# Patient Record
Sex: Female | Born: 1970 | Race: White | Hispanic: No | Marital: Married | Smoking: Never smoker
Health system: Southern US, Community
[De-identification: ages and names within clinical notes are randomized; demographics above are authoritative.]

## PROBLEM LIST (undated history)

## (undated) HISTORY — PX: CHOLECYSTECTOMY: SHX55

---

## 2006-10-09 ENCOUNTER — Ambulatory Visit (HOSPITAL_COMMUNITY): Admission: RE | Admit: 2006-10-09 | Discharge: 2006-10-09 | Payer: Self-pay | Admitting: Surgery

## 2006-10-09 ENCOUNTER — Encounter (INDEPENDENT_AMBULATORY_CARE_PROVIDER_SITE_OTHER): Payer: Self-pay | Admitting: Surgery

## 2007-11-28 ENCOUNTER — Emergency Department (HOSPITAL_COMMUNITY): Admission: EM | Admit: 2007-11-28 | Discharge: 2007-11-28 | Payer: Self-pay | Admitting: Emergency Medicine

## 2009-03-29 IMAGING — CT CT ABDOMEN W/O CM
5 of 9 series · 13 of 42 positions shown, 18 images · IV contrast (CONTRAST)
Comparison: NONE

CLINICAL DATA: Mass seen in liver, evaluate for hemangioma. 

CT ABDOMEN WITHOUT AND WITH INTRAVENOUS CONTRAST AND FOLLOWING 
ORAL  CONTRAST
TECHNIQUE: Axial volumetric data was acquired after oral 
contrast initially.  Subsequently, arterial phase, venous phase, 
and delayed images at 3 minutes, 5 minutes, and 8 minutes were 
obtained after intravenous iodinated contrast injection (Optiray 
350).  Axial and coronal reformatted images were obtained from the 
volumetric data.

[Series 2: wo · axial · 0.75mm/px · 1 of 52 slices shown]
[im 18/52  soft-tissue]
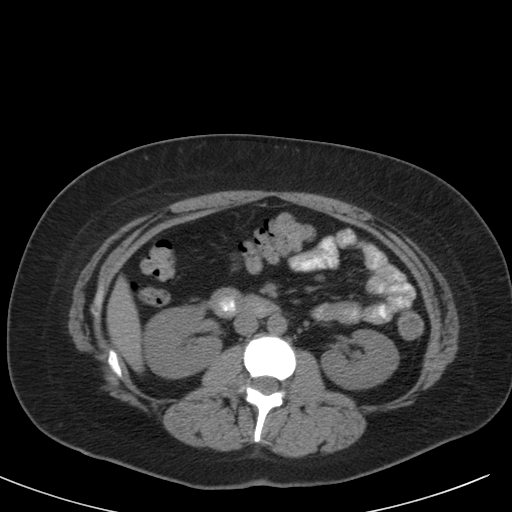

[Series 3: arterial · axial · arterial · 0.75mm/px · z∈[+756,+886]mm · 3 of 52 slices shown]
[im 13/52  soft-tissue]
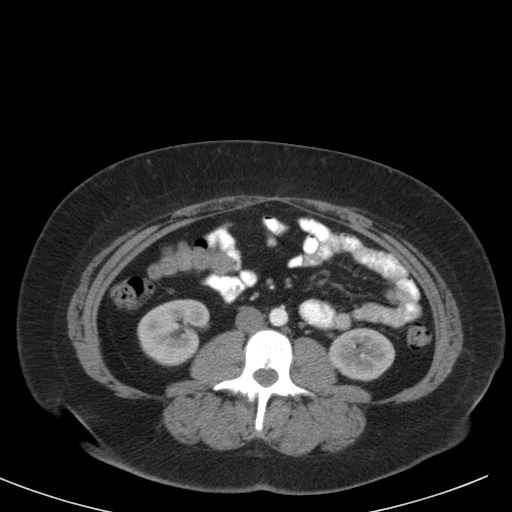
[im 26/52  soft-tissue]
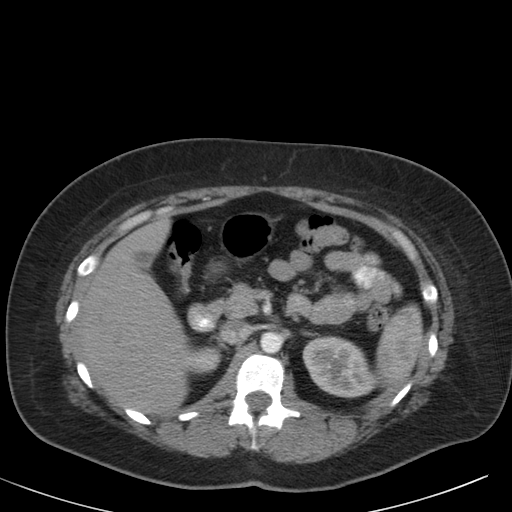
[im 39/52  soft-tissue]
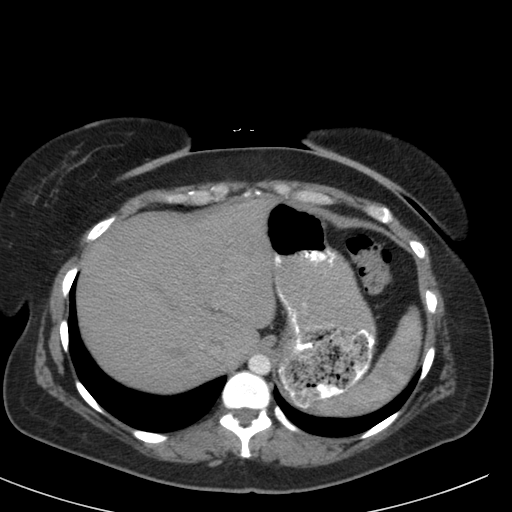

[Series 4: venous · axial · portal-venous · 0.76mm/px · z∈[+755,+885]mm · 3 of 52 slices shown, 7 images]
[im 13/52  soft-tissue]
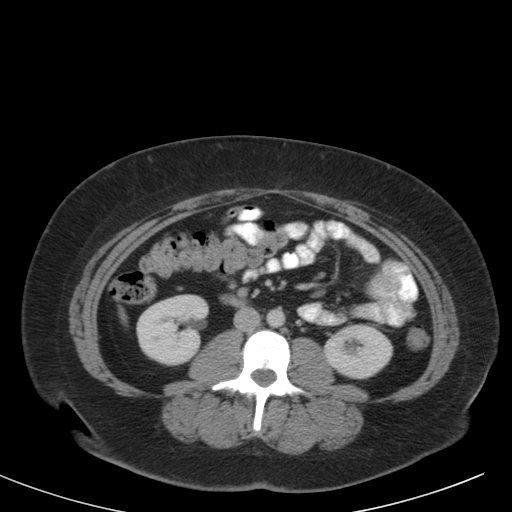
[im 13/52  lung]
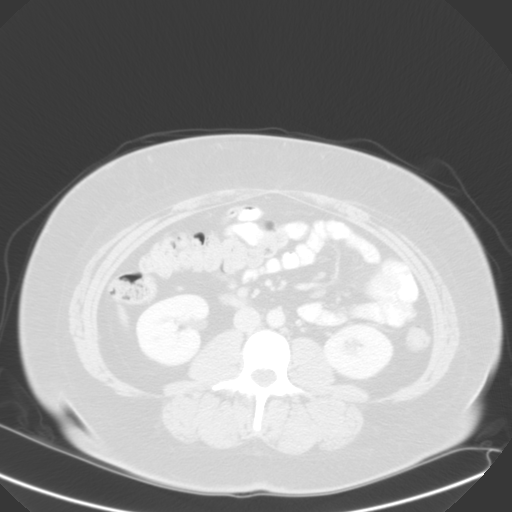
[im 13/52  bone]
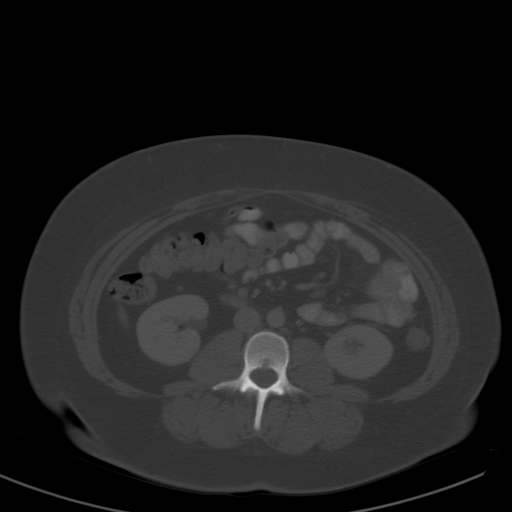
[im 26/52  soft-tissue]
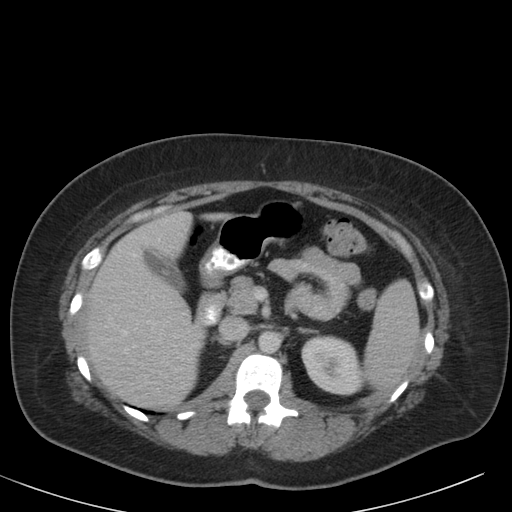
[im 26/52  lung]
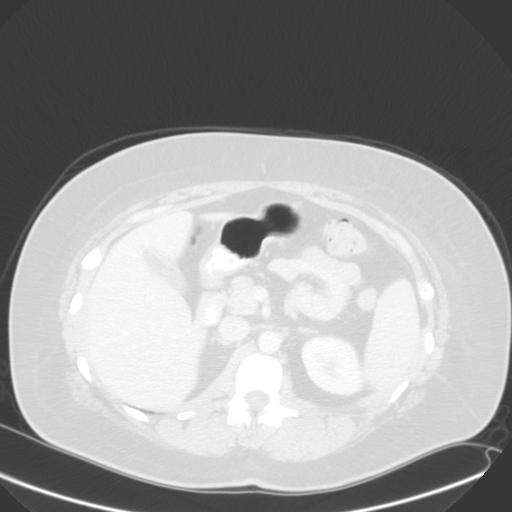
[im 39/52  soft-tissue]
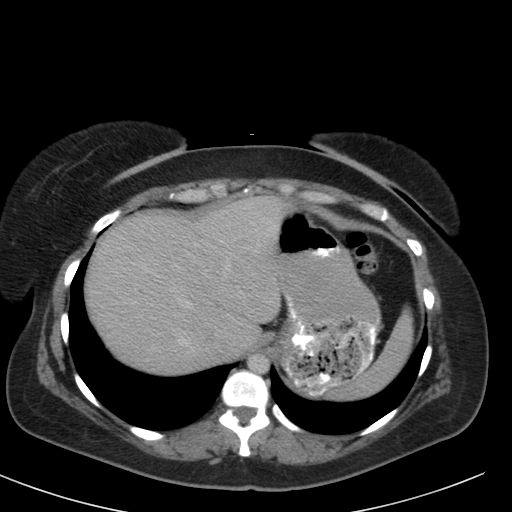
[im 39/52  lung]
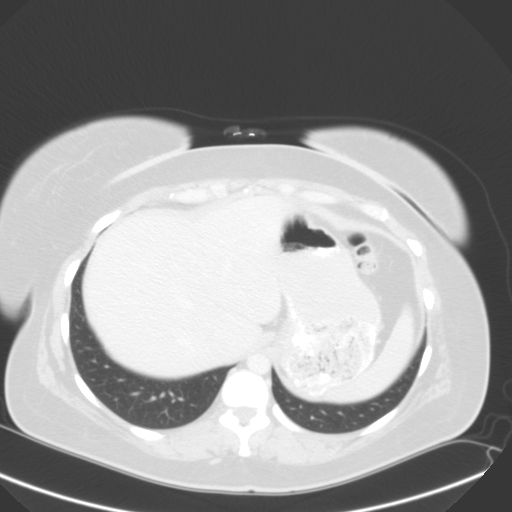

[Series 6: 5 min delay · axial · delayed · 0.76mm/px · z∈[+756,+886]mm · 3 of 52 slices shown]
[im 13/52  soft-tissue]
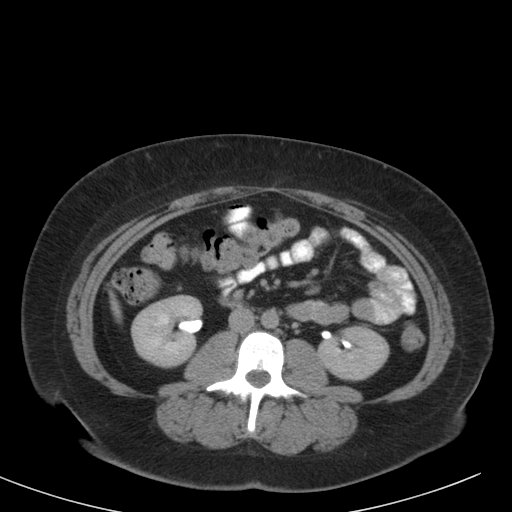
[im 26/52  soft-tissue]
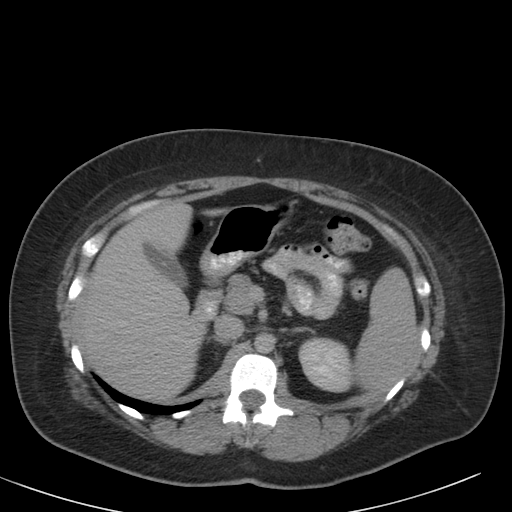
[im 39/52  soft-tissue]
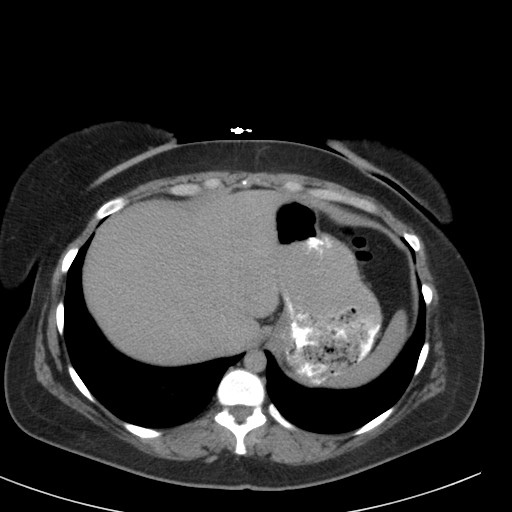

[Series 8084: coronals · coronal · 0.76mm/px · 3 of 66 slices shown, 4 images]
[im 22/66  soft-tissue]
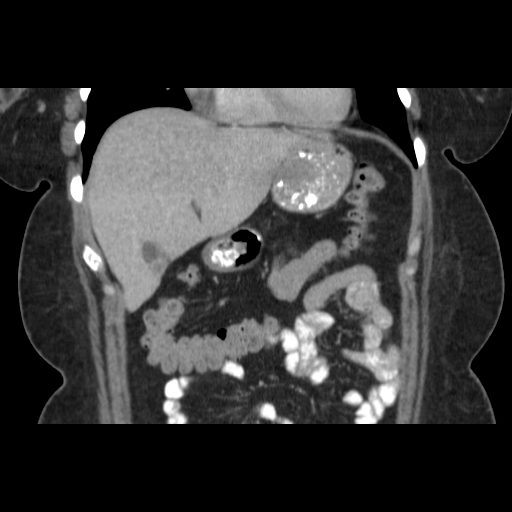
[im 29/66  soft-tissue]
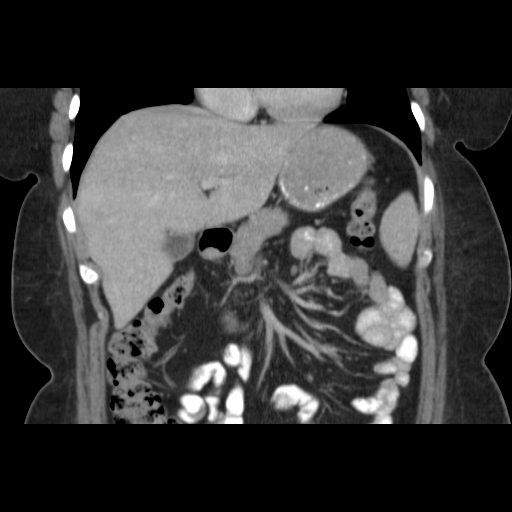
[im 29/66  bone]
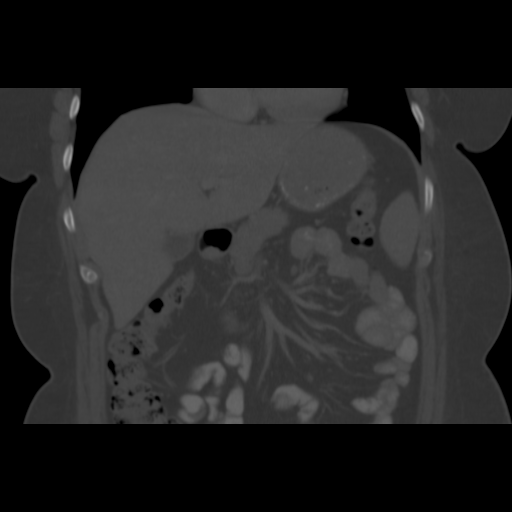
[im 37/66  soft-tissue]
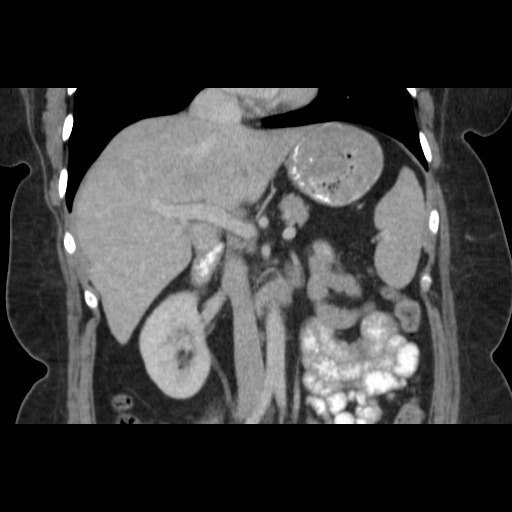

[13 of 42 positions shown; findings below may reference images not displayed]

FINDINGS: The heart size is within normal limits.  Visualized 
portions of the lung bases are within normal limits.  There is a 
hypodense mass seen in the posterior segment of the right lobe of 
the liver.  This measures approximately 0.1x0.2 cm.  This 
demonstrates centripetal enhancement noted on the venous phase and 
delayed images.  This is consistent with a cavernous hemangioma.  
There is a second hypodense area seen on the current study.  This 
is adjacent to the gallbladder fossa.  This does not demonstrate 
and contrast enhancement.  There is no evidence of intraluminal 
nodules or septation.  This measures approximately 1.2x1.4x6.1 cm. 
 The gallbladder, spleen, adrenal glands, and pancreas are within 
normal limits.  The kidneys are within normal limits. No aortic 
aneurysm or lymphadenopathy.  No free fluid.  There are small 
mesenteric nodes seen.  The largest measures approximately 1.0 cm 
in length.  There are no inflammatory changes seen in the abdomen. 
 The appendix is not visualized on the current study.
IMPRESSION: The hyperechoic hepatic mass seen on the ultrasound 
of 08-29-2006 is consistent with a cavernous hemangioma based on 
the current study. There also appears to be a hepatic cyst 
adjacent to the gallbladder fossa. The previously described 
cholelithiasis is not evident on the current study. Ohata Enmy, 
Trans Date: 09/07/2006 JH  JLM

## 2010-05-24 NOTE — Op Note (Signed)
NAMETELISSA, PALMISANO              ACCOUNT NO.:  [id number]   MEDICAL RECORD NO.:  [id number]          PATIENT TYPE:  AMB   LOCATION:  DAY                          FACILITY:  [facility]   PHYSICIAN:  Wilmon Arms. Corliss Skains, M.D. DATE OF BIRTH:  1970/06/16   DATE OF PROCEDURE:  10/09/2006  DATE OF DISCHARGE:                               OPERATIVE REPORT   PREOPERATIVE DIAGNOSIS:  Symptomatic cholelithiasis.   POSTOPERATIVE DIAGNOSIS:  Symptomatic cholelithiasis.   PROCEDURE PERFORMED:  Laparoscopic cholecystectomy with intraoperative  cholangiogram.   SURGEON:  Wilmon Arms. Tsuei, M.D.   ANESTHESIA:  General endotracheal.   INDICATIONS:  The patient is a 40 year old female who presents with  upper abdominal pain.  She had a workup which showed a 2.3-cm hepatic  hemangioma.  She also had several gallstones as well as gallbladder  polyp.  She presents now for elective cholecystectomy.   DESCRIPTION OF PROCEDURE:  The patient was brought to the operating room  and was placed in a supine position on the operating room table.  After  an adequate level of general anesthesia was obtained, the patient's  abdomen was prepped with Betadine and draped in sterile fashion.  Her  umbilicus was infiltrated with 0.25% Marcaine.  A transverse incision  was made below the umbilicus.  Dissection was carried down to the  fascia.  The fascia was grasped with Kocher clamps and opened  vertically.  The peritoneal cavity was bluntly entered.  A stay suture  of Vicryl was placed around the fascial opening.  Pneumoperitoneum was  obtained after inserting a Hasson cannula and securing it with a stay  suture.  We maintained a maximal pressure of 15 mmHg.  The laparoscope  was inserted.  There were some adhesions to the gallbladder.  The  stomach and liver appeared normal.  A 10-mm port was placed in the  subxiphoid position.  The patient is positioned in reversed  Trendelenburg and tilted to her left.  Two 5-mm ports  were placed in the  right upper quadrant.  The gallbladder was grasped with clamps and  elevated over the edge of the liver.  Cautery was used to dissect the  omentum off of the surface of the gallbladder.  We opened the peritoneum  around the hilum of the gallbladder.  The cystic duct was  circumferentially dissected and ligated with a clip distally.  The small  opening was created on the cystic duct.  A Cook cholangiogram catheter  was then inserted through a stab incision and threaded into the cystic  duct.  It was secured with a clip.  A cholangiogram was then obtained  using fluoroscopy, which showed good flow proximally and distally in the  biliary tree.  There was a small filling defect, but this moved rapidly  down into the duodenum; this was felt to be an air bubble.  The catheter  was then removed.  The cystic duct was ligated with clips and divided;  the cystic artery was also ligated clips and divided.  Cautery was then  used to remove the gallbladder from the liver.  The gallbladder was  placed in an EndoCatch sac.  We irrigated the gallbladder fossa and  cautery was used for hemostasis.  The gallbladder and sac were then  removed through the umbilical port site.  The stay suture was used to  close the umbilical fascia.  Pneumoperitoneum was then released as the remainder of ports were  removed.  A 4-0 Monocryl was used to close skin incisions.  Steri-Strips  and clean dressings were applied.  The patient was then extubated and  brought to the recovery room in stable condition.  All sponge,  instrument and needle counts were correct.      Wilmon Arms. Tsuei, M.D.  Electronically Signed     MKT/MEDQ  D:  10/09/2006  T:  10/09/2006  Job:  [id number]

## 2010-10-12 LAB — URINALYSIS, ROUTINE W REFLEX MICROSCOPIC
Urobilinogen, UA: 0.2
pH: 5.5

## 2010-10-12 LAB — URINE MICROSCOPIC-ADD ON

## 2010-10-12 LAB — POCT I-STAT, CHEM 8
BUN: 13
Chloride: 104
Creatinine, Ser: 0.7
Hemoglobin: 15.6 — ABNORMAL HIGH
Potassium: 3.7
TCO2: 24

## 2010-10-12 LAB — CBC
HCT: 43.4
Hemoglobin: 14.2
MCHC: 32.8
RBC: 5.18 — ABNORMAL HIGH
WBC: 10.3

## 2010-10-12 LAB — DIFFERENTIAL
Eosinophils Relative: 1
Lymphs Abs: 1.7
Monocytes Absolute: 0.5
Monocytes Relative: 5

## 2010-10-12 LAB — POCT PREGNANCY, URINE: Preg Test, Ur: NEGATIVE

## 2010-10-20 LAB — COMPREHENSIVE METABOLIC PANEL
Alkaline Phosphatase: 53
CO2: 27
Calcium: 9.7
GFR calc Af Amer: >60
GFR calc non Af Amer: >60
Glucose, Bld: 98
Sodium: 140

## 2010-10-20 LAB — DIFFERENTIAL
Basophils Relative: 0
Eosinophils Absolute: 0.1
Eosinophils Relative: 1
Lymphocytes Relative: 28
Lymphs Abs: 2.6

## 2010-10-20 LAB — CBC
HCT: 39
Hemoglobin: 13.2
MCHC: 33.8
MCV: 79.9
Platelets: 277
RBC: 4.87

## 2010-10-20 LAB — PREGNANCY, URINE: Preg Test, Ur: NEGATIVE

## 2012-02-04 ENCOUNTER — Encounter (HOSPITAL_COMMUNITY): Payer: Self-pay | Admitting: *Deleted

## 2012-02-04 ENCOUNTER — Emergency Department (HOSPITAL_COMMUNITY)
Admission: EM | Admit: 2012-02-04 | Discharge: 2012-02-04 | Disposition: A | Discharge status: Home / Self Care | Payer: Federal, State, Local not specified - PPO | Attending: Emergency Medicine | Admitting: Emergency Medicine

## 2012-02-04 ENCOUNTER — Emergency Department (HOSPITAL_COMMUNITY): Payer: Federal, State, Local not specified - PPO

## 2012-02-04 DIAGNOSIS — R0789 Other chest pain: Secondary | ICD-10-CM

## 2012-02-04 DIAGNOSIS — R0602 Shortness of breath: Secondary | ICD-10-CM | POA: Insufficient documentation

## 2012-02-04 DIAGNOSIS — R059 Cough, unspecified: Secondary | ICD-10-CM | POA: Insufficient documentation

## 2012-02-04 DIAGNOSIS — R05 Cough: Secondary | ICD-10-CM | POA: Insufficient documentation

## 2012-02-04 DIAGNOSIS — R002 Palpitations: Secondary | ICD-10-CM | POA: Insufficient documentation

## 2012-02-04 LAB — CBC WITH DIFFERENTIAL/PLATELET
Basophils Relative: 0 % (ref 0–1)
Eosinophils Absolute: 0.1 10*3/uL (ref 0.0–0.7)
Eosinophils Relative: 1 % (ref 0–5)
Lymphs Abs: 2.7 10*3/uL (ref 0.7–4.0)
MCH: 27.8 pg (ref 26.0–34.0)
MCHC: 34.3 g/dL (ref 30.0–36.0)
MCV: 80.9 fL (ref 78.0–100.0)
Neutrophils Relative %: 62 % (ref 43–77)
Platelets: 317 10*3/uL (ref 150–400)
RDW: 12.8 % (ref 11.5–15.5)

## 2012-02-04 LAB — POCT I-STAT TROPONIN I: Troponin i, poc: 0 ng/mL (ref 0.00–0.08)

## 2012-02-04 LAB — POCT I-STAT, CHEM 8
Creatinine, Ser: 0.5 mg/dL (ref 0.50–1.10)
HCT: 43 % (ref 36.0–46.0)
Hemoglobin: 14.6 g/dL (ref 12.0–15.0)
Potassium: 3.6 mEq/L (ref 3.5–5.1)
Sodium: 140 mEq/L (ref 135–145)
TCO2: 25 mmol/L (ref 0–100)

## 2012-02-04 NOTE — ED Notes (Signed)
Pt on monitor 

## 2012-02-04 NOTE — ED Provider Notes (Signed)
History     CSN: [id number]  Arrival date & time 02/04/12  1126   First MD Initiated Contact with Patient 02/04/12 1134      Chief Complaint  Patient presents with  . Shortness of Breath  . Chest Pain    (Consider location/radiation/quality/duration/timing/severity/associated sxs/prior treatment) HPI Complaint of chest pain onset 2 weeks ago intermittent lasting 10 minutes at a time worse with lying supine or lying prone she feels "my heart racing"with changing positions symptoms are nonexertional. Patient walks 4 miles each day. Denies shortness of breath. No nausea no sweatiness associated with chest discomfort. No nausea no fever patient also reports cough for approximately 2 weeks,  nonproductive. No treatment prior to coming here. History reviewed. No pertinent past medical history.  Past Surgical History  Procedure Date  . Cholecystectomy     History reviewed. No pertinent family history.  History  Substance Use Topics  . Smoking status: Not on file  . Smokeless tobacco: Not on file  . Alcohol Use:    No tobacco no alcohol no drugs OB History    Grav Para Term Preterm Abortions TAB SAB Ect Mult Living                  Review of Systems  Constitutional: Negative.   HENT: Negative.   Respiratory: Positive for cough.   Cardiovascular: Positive for chest pain and palpitations.  Gastrointestinal: Negative.   Musculoskeletal: Negative.   Skin: Negative.   Neurological: Negative.   Hematological: Negative.   Psychiatric/Behavioral: Negative.   All other systems reviewed and are negative.    Allergies  Asa; Ibuprofen; and Naproxen  Home Medications  No current outpatient prescriptions on file.  There were no vitals taken for this visit.  Physical Exam  Nursing note and vitals reviewed. Constitutional: She is oriented to person, place, and time. She appears well-developed and well-nourished.  HENT:  Head: Normocephalic and atraumatic.  Eyes:  Conjunctivae normal are normal. Pupils are equal, round, and reactive to light.  Neck: Neck supple. No tracheal deviation present. No thyromegaly present.  Cardiovascular: Normal rate and regular rhythm.   No murmur heard. Pulmonary/Chest: Effort normal and breath sounds normal.  Abdominal: Soft. Bowel sounds are normal. She exhibits no distension. There is no tenderness.  Musculoskeletal: Normal range of motion. She exhibits no edema and no tenderness.  Neurological: She is alert and oriented to person, place, and time. Coordination normal.  Skin: Skin is warm and dry. No rash noted.  Psychiatric: She has a normal mood and affect.    ED Course  Procedures (including critical care time)  Labs Reviewed - No data to display No results found.  Chest x-ray reviewed by me No diagnosis found.   Date: 02/04/2012  Rate: 95  Rhythm: normal sinus rhythm and premature ventricular contractions (PVC)  QRS Axis: right  Intervals: normal  ST/T Wave abnormalities: nonspecific T wave changes  Conduction Disutrbances:none  Narrative Interpretation:   Old EKG Reviewed: none available Results for orders placed during the hospital encounter of 02/04/12  CBC WITH DIFFERENTIAL      Component Value Range   WBC 9.1  4.0 - 10.5 K/uL   RBC 4.93  3.87 - 5.11 MIL/uL   Hemoglobin 13.7  12.0 - 15.0 g/dL   HCT 11.9  14.7 - 82.9 %   MCV 80.9  78.0 - 100.0 fL   MCH 27.8  26.0 - 34.0 pg   MCHC 34.3  30.0 - 36.0 g/dL   RDW  12.8  11.5 - 15.5 %   Platelets 317  150 - 400 K/uL   Neutrophils Relative 62  43 - 77 %   Neutro Abs 5.7  1.7 - 7.7 K/uL   Lymphocytes Relative 29  12 - 46 %   Lymphs Abs 2.7  0.7 - 4.0 K/uL   Monocytes Relative 7  3 - 12 %   Monocytes Absolute 0.6  0.1 - 1.0 K/uL   Eosinophils Relative 1  0 - 5 %   Eosinophils Absolute 0.1  0.0 - 0.7 K/uL   Basophils Relative 0  0 - 1 %   Basophils Absolute 0.0  0.0 - 0.1 K/uL  POCT I-STAT, CHEM 8      Component Value Range   Sodium 140  135 -  145 mEq/L   Potassium 3.6  3.5 - 5.1 mEq/L   Chloride 107  96 - 112 mEq/L   BUN 10  6 - 23 mg/dL   Creatinine, Ser 0.98  0.50 - 1.10 mg/dL   Glucose, Bld 92  70 - 99 mg/dL   Calcium, Ion 1.19  1.47 - 1.23 mmol/L   TCO2 25  0 - 100 mmol/L   Hemoglobin 14.6  12.0 - 15.0 g/dL   HCT 82.9  56.2 - 13.0 %  POCT I-STAT TROPONIN I      Component Value Range   Troponin i, poc 0.00  0.00 - 0.08 ng/mL   Comment 3           POCT I-STAT TROPONIN I      Component Value Range   Troponin i, poc 0.00  0.00 - 0.08 ng/mL   Comment 3            Dg Chest Port 1 View  02/04/2012  *RADIOLOGY REPORT*  Clinical Data: Chest pain and cough.  CHEST - 1 VIEW  Comparison:  None.  Findings: The heart size and mediastinal contours are within normal limits.  Both lungs are clear.  IMPRESSION: No active disease.   Original Report Authenticated By: Irish Lack, M.D.     5:25 PM patient resting comfortably MDM  Cardiac risk factors none. Pt PERC neg Doubt acute coronary syndrome with highly atypical symptoms negative risk factors in this young menstruating female Case discussed with Bensihmon Plan office call patient tomorrow for followup this week and outpatient cardiac workup Meds #1 atypical chest pain  #2 premature ventricular contractions       Doug Sou, MD 02/04/12 1730

## 2012-02-04 NOTE — ED Notes (Signed)
To ED for eval of feeling rumbling in chest. States she feels pounding when she lays on her stomach. Speaks in full complete sentences. No resp distress noted. Pt laughing and appears comfortable. Denies complaints at present

## 2014-08-06 ENCOUNTER — Encounter (HOSPITAL_COMMUNITY): Payer: Self-pay | Admitting: Emergency Medicine

## 2014-08-06 ENCOUNTER — Emergency Department (HOSPITAL_COMMUNITY)
Admission: EM | Admit: 2014-08-06 | Discharge: 2014-08-06 | Disposition: A | Discharge status: Home / Self Care | Payer: Federal, State, Local not specified - PPO | Attending: Emergency Medicine | Admitting: Emergency Medicine

## 2014-08-06 DIAGNOSIS — Z23 Encounter for immunization: Secondary | ICD-10-CM | POA: Diagnosis not present

## 2014-08-06 DIAGNOSIS — M79672 Pain in left foot: Secondary | ICD-10-CM | POA: Diagnosis present

## 2014-08-06 DIAGNOSIS — L0291 Cutaneous abscess, unspecified: Secondary | ICD-10-CM

## 2014-08-06 DIAGNOSIS — L02612 Cutaneous abscess of left foot: Secondary | ICD-10-CM | POA: Insufficient documentation

## 2014-08-06 MED ORDER — SULFAMETHOXAZOLE-TRIMETHOPRIM 800-160 MG PO TABS: 1.0000 | ORAL_TABLET | Freq: Two times a day (BID) | ORAL | Status: DC

## 2014-08-06 MED ORDER — TETANUS-DIPHTH-ACELL PERTUSSIS 5-2.5-18.5 LF-MCG/0.5 IM SUSP
0.5000 mL | Freq: Once | INTRAMUSCULAR | Status: AC
  Administered 2014-08-06: 0.5 mL via INTRAMUSCULAR
  Filled 2014-08-06: qty 0.5

## 2014-08-06 MED ORDER — CEPHALEXIN 500 MG PO CAPS: 500.0000 mg | ORAL_CAPSULE | Freq: Four times a day (QID) | ORAL | Status: DC

## 2014-08-06 NOTE — ED Provider Notes (Signed)
CSN: [id number]     Arrival date & time 08/06/14  1359 History  This chart was scribed for non-physician practitioner, Jinny Sanders, PA-C working with Nelva Nay, MD by Gwenyth Ober, ED scribe. This patient was seen in room TR06C/TR06C and the patient's care was started at 3:33 PM   Chief Complaint  Patient presents with  . Foot Pain   The history is provided by the patient. No language interpreter was used.   HPI Comments: Carolyn Fitzgerald is a 44 y.o. female who presents to the Emergency Department complaining of a small wound, with burning and stinging pain, on the plantar aspect of her left foot that started yesterday. Pt states mild swelling of the area as an associated symptom. Her pain becomes worse with bearing weight. Pt tried an OTC foot lotion with no relief. Pt reports the onset of her pain started after she cut off dry skin. She does not remember stepping on any foreign objects. Her Tetanus is out of date. Pt denies numbness as an associated symptom.   History reviewed. No pertinent past medical history. Past Surgical History  Procedure Laterality Date  . Cholecystectomy     No family history on file. History  Substance Use Topics  . Smoking status: Never Smoker   . Smokeless tobacco: Not on file  . Alcohol Use: No   OB History    No data available     Review of Systems  Musculoskeletal: Positive for arthralgias.  Skin: Positive for wound.  Neurological: Negative for numbness.   Allergies  Asa; Ibuprofen; and Naproxen  Home Medications   Prior to Admission medications   Medication Sig Start Date End Date Taking? Authorizing Provider  acetaminophen (TYLENOL) 500 MG tablet Take 500 mg by mouth every 6 (six) hours as needed.    Historical Provider, MD  cephALEXin (KEFLEX) 500 MG capsule Take 1 capsule (500 mg total) by mouth 4 (four) times daily. 08/06/14   Ladona Mow, PA-C  sulfamethoxazole-trimethoprim (BACTRIM DS,SEPTRA DS) 800-160 MG per tablet Take 1 tablet  by mouth 2 (two) times daily. 08/06/14   Ladona Mow, PA-C   BP 130/78 mmHg  Pulse 91  Temp(Src) 98.2 F (36.8 C) (Oral)  Resp 18  Ht 5\' 3"  (1.6 m)  Wt 200 lb (90.719 kg)  BMI 35.44 kg/m2  SpO2 100%  LMP 07/07/2014 Physical Exam  Constitutional: She appears well-developed and well-nourished. No distress.  HENT:  Head: Normocephalic and atraumatic.  Eyes: Conjunctivae and EOM are normal.  Neck: Neck supple. No tracheal deviation present.  Cardiovascular: Normal rate.   Pulmonary/Chest: Effort normal. No respiratory distress.  Skin: Skin is warm and dry. There is erythema.  0.5 x 0.5 cm area of superficial infection with some surrounding erythema on the plantar aspect of her lateral distal metatarsals   Psychiatric: She has a normal mood and affect. Her behavior is normal.  Nursing note and vitals reviewed.   ED Course  INCISION AND DRAINAGE Date/Time: 08/06/2014 4:21 PM Performed by: Ladona Mow Authorized by: Ladona Mow Consent: Verbal consent obtained. Risks and benefits: risks, benefits and alternatives were discussed Consent given by: patient Patient identity confirmed: verbally with patient Type: abscess Body area: lower extremity Location details: left foot Needle gauge: 18 Incision type: single straight Complexity: simple Drainage: purulent Drainage amount: scant Wound treatment: wound left open Patient tolerance: Patient tolerated the procedure well with no immediate complications Comments: Small superficial abscess. Unroofed with 18-gauge needle.     DIAGNOSTIC STUDIES: Oxygen Saturation is  100% on RA, normal by my interpretation.    COORDINATION OF CARE: 3:40 PM Suspect minor infection. Discussed treatment plan with pt which includes antibiotics and drainage with a needle. Advised pt to soak her feet a few times a day in Epsom salts. Will prescribe orthopedic shoe. Pt agreed to plan.   Labs Review Labs Reviewed - No data to display  Imaging Review No  results found.   EKG Interpretation None      MDM   Final diagnoses:  Abscess    Patient with skin abscess amenable to incision and drainage.  Abscess was not large enough to warrant packing or drain,  wound recheck in 2 days. Strongly encouraged home warm soaks and flushing.  Mild signs of cellulitis is surrounding skin.  Will d/c to home.  Patient placed on Keflex and Bactrim due to mild cellulitis surrounding abscess. Return precautions discussed, patient verbalizes understanding and agreement of this plan.  I personally performed the services described in this documentation, which was scribed in my presence. The recorded information has been reviewed and is accurate.  BP 130/78 mmHg  Pulse 91  Temp(Src) 98.2 F (36.8 C) (Oral)  Resp 18  Ht  (1.6 m)  Wt 200 lb (90.719 kg)  BMI 35.44 kg/m2  SpO2 100%  LMP 07/07/2014  Signed,  Ladona Mow, PA-C 5:19 PM    Ladona Mow, PA-C 08/06/14 1719  Nelva Nay, MD 08/07/14 [phone]

## 2014-08-06 NOTE — ED Notes (Signed)
Pt. Stated, I had dry skin on my left foot and it came off and now my foot burns and I can't hardly stand on it.

## 2014-08-06 NOTE — ED Notes (Addendum)
Disregard all departure conditions.  Charted in error.

## 2014-08-06 NOTE — Discharge Instructions (Signed)
Abscess °An abscess is an infected area that contains a collection of pus and debris. It can occur in almost any part of the body. An abscess is also known as a furuncle or boil. °CAUSES  °An abscess occurs when tissue gets infected. This can occur from blockage of oil or sweat glands, infection of hair follicles, or a minor injury to the skin. As the body tries to fight the infection, pus collects in the area and creates pressure under the skin. This pressure causes pain. People with weakened immune systems have difficulty fighting infections and get certain abscesses more often.  °SYMPTOMS °Usually an abscess develops on the skin and becomes a painful mass that is red, warm, and tender. If the abscess forms under the skin, you may feel a moveable soft area under the skin. Some abscesses break open (rupture) on their own, but most will continue to get worse without care. The infection can spread deeper into the body and eventually into the bloodstream, causing you to feel ill.  °DIAGNOSIS  °Your caregiver will take your medical history and perform a physical exam. A sample of fluid may also be taken from the abscess to determine what is causing your infection. °TREATMENT  °Your caregiver may prescribe antibiotic medicines to fight the infection. However, taking antibiotics alone usually does not cure an abscess. Your caregiver may need to make a small cut (incision) in the abscess to drain the pus. In some cases, gauze is packed into the abscess to reduce pain and to continue draining the area. °HOME CARE INSTRUCTIONS  °· Only take over-the-counter or prescription medicines for pain, discomfort, or fever as directed by your caregiver. °· If you were prescribed antibiotics, take them as directed. Finish them even if you start to feel better. °· If gauze is used, follow your caregiver's directions for changing the gauze. °· To avoid spreading the infection: °¨ Keep your draining abscess covered with a  bandage. °¨ Wash your hands well. °¨ Do not share personal care items, towels, or whirlpools with others. °¨ Avoid skin contact with others. °· Keep your skin and clothes clean around the abscess. °· Keep all follow-up appointments as directed by your caregiver. °SEEK MEDICAL CARE IF:  °· You have increased pain, swelling, redness, fluid drainage, or bleeding. °· You have muscle aches, chills, or a general ill feeling. °· You have a fever. °MAKE SURE YOU:  °· Understand these instructions. °· Will watch your condition. °· Will get help right away if you are not doing well or get worse. °Document Released: 10/05/2004 Document Revised: 06/27/2011 Document Reviewed: 03/10/2011 °ExitCare® Patient Information ©2015 [employer]. This information is not intended to replace advice given to you by your health care provider. Make sure you discuss any questions you have with your health care provider. ° ° °Emergency Department Resource Guide °1) Find a Doctor and Pay Out of Pocket °Although you won't have to find out who is covered by your insurance plan, it is a good idea to ask around and get recommendations. You will then need to call the office and see if the doctor you have chosen will accept you as a new patient and what types of options they offer for patients who are self-pay. Some doctors offer discounts or will set up payment plans for their patients who do not have insurance, but you will need to ask so you aren't surprised when you get to your appointment. ° °2) Contact Your Local Health Department °Not all health departments   have doctors that can see patients for sick visits, but many do, so it is worth a call to see if yours does. If you don't know where your local health department is, you can check in your phone book. The CDC also has a tool to help you locate your state's health department, and many state websites also have listings of all of their local health departments. ° °3) Find a Walk-in Clinic °If  your illness is not likely to be very severe or complicated, you may want to try a walk in clinic. These are popping up all over the country in pharmacies, drugstores, and shopping centers. They're usually staffed by nurse practitioners or physician assistants that have been trained to treat common illnesses and complaints. They're usually fairly quick and inexpensive. However, if you have serious medical issues or chronic medical problems, these are probably not your best option. ° °No Primary Care Doctor: °- Call [facility] at  832-8000 - they can help you locate a primary care doctor that  accepts your insurance, provides certain services, etc. °- Physician Referral Service- 1-800-533-3463 ° °Chronic Pain Problems: °Organization         Address  Phone   Notes  °[facility]  (336) 297-2271 Patients need to be referred by their primary care doctor.  ° °Medication Assistance: °Organization         Address  Phone   Notes  °[place] [facility] [address] °[place], [place] [address] (336) 641-8030 --Must be a resident of [place] °-- Must have NO insurance coverage whatsoever (no Medicaid/ Medicare, etc.) °-- The pt. MUST have a primary care doctor that directs their care regularly and follows them in the community °  °[facility]  (866) 331-1348   °[employer]  (888) 892-1162   ° °Agencies that provide inexpensive medical care: °Organization         Address  Phone   Notes  °[facility]  (336) 832-8035   °[facility]    (336) 832-7272   °[facility] [address] °[place], [place] [address] (336) 832-4777   °[facility] [address], °[place] (336) 271-4999   °[facility]    (336) 373-0678   °[facility]    (336) 272-1050   °[facility] ° [address], [place] Phone:  (336) 832-4444, Fax:  (336) 832-4440 Hours of  Operation:  9 am - 6 pm, M-F.  Also accepts Medicaid/Medicare and self-pay.  °[facility] ° [address], [place] Phone: (336) [phone], Fax: (336) [phone]. Hours of Operation:  8:30 am - 5:30 pm, M-F.  Also accepts Medicaid and self-pay.  °[facility] [place] [address], [place] Phone: (336) 878-6027   °[facility] [address], [place], [place] (336)723-1848, Ext. 123 Mondays & Thursdays: 7-9 AM.  First 15 patients are seen on a first come, first serve basis. °  ° °Medicaid-accepting [place] Providers: ° °Organization         Address  Phone   Notes  °[facility] [address], [place] (336) 641-2100 Also accepts self-pay patients.  °[facility] [address], [place] ° (336) 856-9996   °[facility] [address] [place] [address],  (336) 288-8857   °[facility]   [address] [place] [address], [place] (336) 299-7000   °Veita Bland [address], [place]  ° (336) 373-1557 Only accepts [place] Access Medicaid patients after they have their name applied to their card.  ° °Self-Pay (no insurance) in [place]: ° °Organization         Address  Phone   Notes  °Sickle Cell Patients, [facility] [address], [place] (336) 832-1970   °[facility] [address], [place] (336) 832-4400   °[facility] ° [address], [place] (336) 992-4800   °[facility]/Dr. Osei-Bonsu ° [address] [place] [address], [place] or [address], [place] (336) [phone] Phone number for both [place] and [place] locations is the same.  °[facility] [address], [place] (336) 299-0000   °[facility] [address] [place] [address], [place] or [address] (336) 852-7530 °(336) 878-2260   °[facility] [address], [place] (336) 350-1642, phone; (336) 294-5005, fax Sees patients 1st and 3rd Saturday of every month.  Must not qualify for public or private insurance (i.e. Medicaid, Medicare, [place] Health Choice, Veterans' Benefits) • Household income should be no more than 200% of the poverty level •The clinic cannot treat you if you are pregnant or think you are pregnant • Sexually transmitted diseases are not treated at the clinic.  ° ° °Dental Care: °Organization         Address  Phone  Notes  °[facility] [address], [place] (336) 641-6152 Accepts children up to age 21 who are enrolled in Medicaid or [place] Health Choice; pregnant women with a Medicaid card; and children who have applied for Medicaid or [place] Health Choice, but were declined, whose parents can pay a reduced fee at time of service.  °[facility] [place]  [address], [place] (336) 641-7733 Accepts children up to age 21 who are enrolled in Medicaid or [place] Health Choice; pregnant women with a Medicaid card; and children who have applied for Medicaid or [place] Health Choice, but were declined, whose parents can pay a reduced fee at time of service.  °[facility] ° [address], [place] (336) 641-4533 Patients are seen by appointment only. Walk-ins are not accepted. [facility] will see patients 18 years of age and older. °Monday - Tuesday (8am-5pm) °Most Wednesdays (8:30-5pm) °$30 per visit, cash only  °[facility] ° [address], [place] (336) 641-4533 Patients are seen by appointment only. Walk-ins are not accepted. [facility] will see patients 18 years of age and older. °One Wednesday Evening (Monthly: Volunteer Based).  $30 per visit, cash only  °[facility]  (919) 537-3737 for adults; Children under age 4, call Graduate Pediatric  Dentistry at ([phone]) [phone]. Children aged 4-14, please call ([phone]) [phone] to request a pediatric application. ° Dental services are provided in all areas of dental care including fillings, crowns and bridges, complete and partial dentures, implants, gum treatment, root canals, and extractions. Preventive care is also provided. Treatment is provided to both adults and children. °Patients are selected via a lottery and there is often a waiting list. °  °[facility] [address], ° ° (336) 763-8833 [email or web address] °  °[facility]   [address], [place], [place] (336)723-1848, Ext. 123 Second and Fourth Thursday of each month, opens at 6:30 AM; Clinic ends at 9 AM.  Patients are seen on a first-come first-served basis, and a limited number are seen during each clinic.  ° °[facility] ° [address], [place], [place] (336) 723-7904   Eligibility Requirements °You must have lived in [place], [place], or [place] counties for at least the last three months. °  You cannot be eligible for state or federal sponsored healthcare insurance, including Veterans Administration, Medicaid, or Medicare. °  You generally cannot be eligible for healthcare insurance through your employer.  °  How to apply: °Eligibility screenings are held every Tuesday and Wednesday afternoon from 1:00 pm until 4:00 pm. You do not need an appointment for the interview!  °[facility] [address], [place], [place] 336-631-2330   °[facility]  336-342-8273   °[facility]  336-703-3100   °[facility]  336-570-6415   ° °Behavioral Health Resources in the Community: °Intensive Outpatient Programs °Organization         Address  Phone  Notes  °[facility] [address], [place], [place] 336-878-6098   °[facility] [address], [place], [place] 336-832-9800   °[facility] [address], [place], [place] ° 336-882-2125   °[facility] [address],  °[place], [place] 1-800-853-5163 or 336-641-4981   °Substance Abuse Resources °Organization         Address  Phone  Notes  °[facility]  336-882-2125   °[facility]  336-784-9470   °[facility]  336-285-9073   °[facility]  336-845-3988   °Residential & Outpatient Substance Abuse Program  1-800-659-3381   °Psychological Services °Organization         Address  Phone  Notes  °[facility]  336- 832-9600   °[facility]  336- 378-7881   °[facility] [address], [place] 1-800-853-5163 or 336-641-4981   ° °Mobile Crisis Teams °Organization         Address  Phone  Notes  °[facility], Mobile Crisis Care Unit  1-877-626-1772   °Assertive °[facility] ° [address] [place], [place] 336-834-9664   °Sharon DeEsch [address] °[place] 336-554-5454   ° °Self-Help/Support Groups °Organization         Address  Phone             Notes  °[facility] [place] - variety of support groups  336- 373-1402 Call for more information  °Narcotics Anonymous (NA), [facility] [address], °[place]  2 meetings at this location  ° °Residential Treatment Programs °Organization         Address  Phone  Notes  °[facility] [address],    °[place]  1-866-801-8205   °[facility] ° [address], [place], [place] 704-293-8524   °[facility] [address], [place] 336-845-3988 Admissions: 8am-3pm M-F  °[facility] [address]    °[place], [place] 336-841-1104   °[facility] [address], , [place] 336-379-7146   °[facility] [address]  °,  336-285-9073   °[facility] - Intensive Outpatient [address],    [place], [place] 336-852-3033   °[facility] [address]  °[place], [place] 1-877-615-2722 or 336-784-9470   °[facility] [address] [place], [place] 336-227-7417 Accepts Medicaid  °[facility] [address]  ° [place] 1-800-659-3381 Substance Abuse/Addiction Treatment  ° °[place] Behavioral Health Resources °Organization         Address  Phone  Notes  °[facility]  (888) 581-9988   °Julie Brannon, PhD [address] [place], [place]   (336) 349-5553 or (336) 951-0000   °[facility]   [address] °[place], [place] (336) 349-4454   °[facility] [address], [place], [place] (336) 342-8316 Insurance/Medicaid/sponsorship through [facility]  °[facility] [address]                                    [place], [place] (336) 342-8316 Therapy/tele-psych/case  °[facility] [address]  ° [place], [place] (336) 349-2233    °Dr. Arfeen  (336) 349-4544   °[facility] [place]  [employer] [place] [facility] 1) [address], [place] °2) [address], [place] °3)  [address], [place] (336) 349-3220 °(336) 342-7768 ° °(336) 342-8140   °[place] [facility] (336) 342-1394 or (336) 342-3537 (After Hours)    ° ° ° °

## 2019-04-17 ENCOUNTER — Ambulatory Visit: Payer: Federal, State, Local not specified - PPO | Admitting: Internal Medicine

## 2021-08-20 ENCOUNTER — Other Ambulatory Visit: Payer: Self-pay | Admitting: Nurse Practitioner
